# Patient Record
Sex: Female | Born: 1960 | Race: White | Hispanic: No | Marital: Single
Health system: Southern US, Community
[De-identification: ages and names within clinical notes are randomized; demographics above are authoritative.]

---

## 2014-04-27 ENCOUNTER — Telehealth: Payer: Self-pay | Admitting: *Deleted

## 2014-04-27 NOTE — Telephone Encounter (Signed)
Mailed medical releases to pt on 3/17 & pt to fax back to me.  Have not received anything yet.  Called and left a message for the pt to call me w/ a status of the releases so I can obtain them to get pt scheduled.

## 2016-10-26 ENCOUNTER — Other Ambulatory Visit: Payer: Self-pay | Admitting: Family Medicine

## 2016-10-26 ENCOUNTER — Ambulatory Visit
Admission: RE | Admit: 2016-10-26 | Discharge: 2016-10-26 | Disposition: A | Discharge status: Home / Self Care | Payer: BC Managed Care – PPO | Source: Ambulatory Visit | Attending: Family Medicine | Admitting: Family Medicine

## 2016-10-26 DIAGNOSIS — M25512 Pain in left shoulder: Secondary | ICD-10-CM

## 2016-11-03 ENCOUNTER — Other Ambulatory Visit (HOSPITAL_COMMUNITY): Payer: Self-pay | Admitting: Family Medicine

## 2016-11-03 DIAGNOSIS — M25512 Pain in left shoulder: Secondary | ICD-10-CM

## 2016-11-06 ENCOUNTER — Encounter (HOSPITAL_COMMUNITY): Payer: Self-pay

## 2016-11-06 ENCOUNTER — Ambulatory Visit (HOSPITAL_COMMUNITY)
Admission: RE | Admit: 2016-11-06 | Discharge: 2016-11-06 | Disposition: A | Discharge status: Home / Self Care | Payer: BC Managed Care – PPO | Source: Ambulatory Visit | Attending: Family Medicine | Admitting: Family Medicine

## 2019-04-02 ENCOUNTER — Ambulatory Visit: Payer: BC Managed Care – PPO

## 2019-07-04 ENCOUNTER — Other Ambulatory Visit: Payer: Self-pay

## 2019-07-04 ENCOUNTER — Ambulatory Visit
Admission: RE | Admit: 2019-07-04 | Discharge: 2019-07-04 | Disposition: A | Discharge status: Home / Self Care | Payer: BC Managed Care – PPO | Source: Ambulatory Visit | Attending: Family Medicine | Admitting: Family Medicine

## 2019-07-04 ENCOUNTER — Other Ambulatory Visit: Payer: Self-pay | Admitting: Family Medicine

## 2019-07-04 DIAGNOSIS — R52 Pain, unspecified: Secondary | ICD-10-CM

## 2020-08-20 ENCOUNTER — Ambulatory Visit
Admission: RE | Admit: 2020-08-20 | Discharge: 2020-08-20 | Disposition: A | Discharge status: Home / Self Care | Payer: BC Managed Care – PPO | Source: Ambulatory Visit | Attending: Family Medicine | Admitting: Family Medicine

## 2020-08-20 ENCOUNTER — Other Ambulatory Visit: Payer: Self-pay

## 2020-08-20 ENCOUNTER — Other Ambulatory Visit: Payer: Self-pay | Admitting: Family Medicine

## 2020-08-20 DIAGNOSIS — M25511 Pain in right shoulder: Secondary | ICD-10-CM

## 2021-06-07 IMAGING — CR DG ABDOMEN 1V
1 series · 1 of 1 positions shown · non-contrast
Comparison: None.

CLINICAL DATA: Right flank pain

EXAM:
ABDOMEN - 1 VIEW

[t abdomen supine]
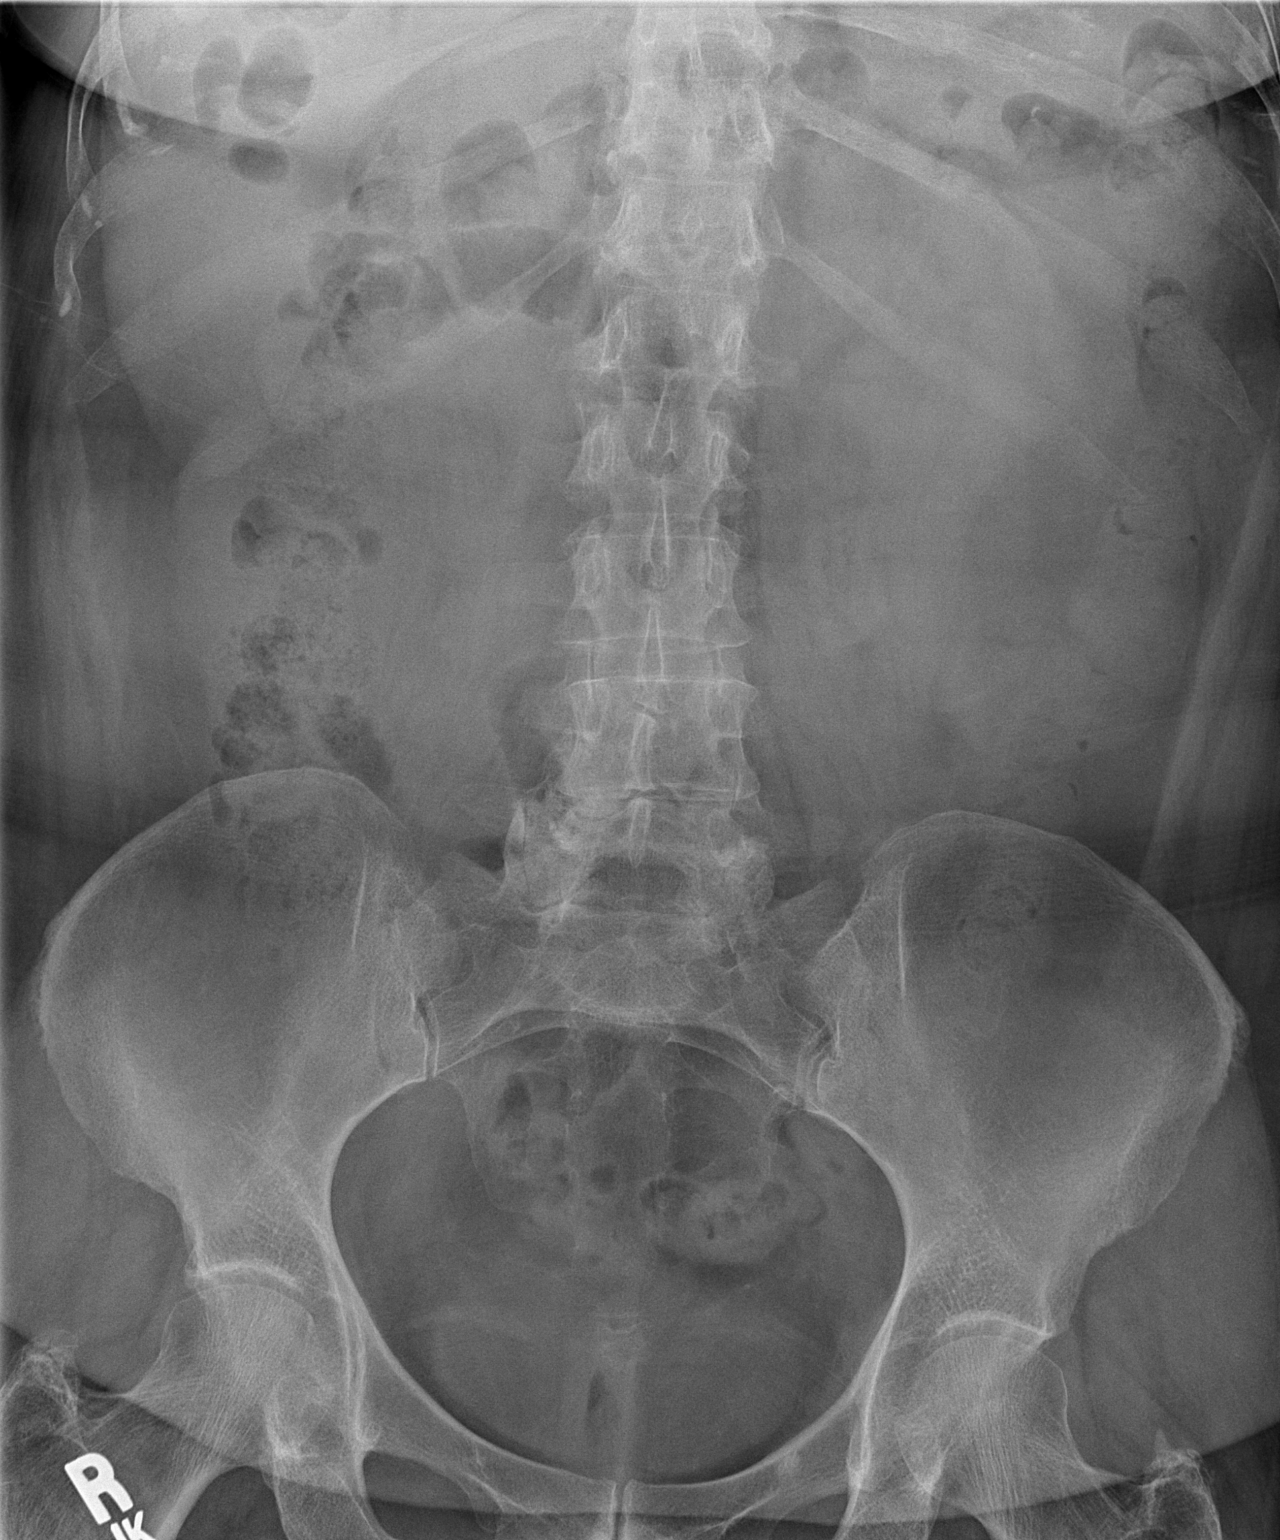

[1 of 1 positions shown; findings below may reference images not displayed]

FINDINGS: Supine frontal view of the abdomen and pelvis excludes the
hemidiaphragms by collimation. No bowel obstruction or ileus. There
are no masses or abnormal calcifications. No acute bony
abnormalities.
IMPRESSION: 1. Unremarkable exam. No evidence of radiopaque urinary tract
calculi.

## 2022-07-25 IMAGING — CR DG SHOULDER 2+V*R*
3 series · 3 of 3 positions shown · non-contrast
Comparison: Left shoulder radiograph dated 10/26/2016.

CLINICAL DATA: 68-year-old female with right shoulder pain.

EXAM:
RIGHT SHOULDER - 2+ VIEW

[w shoulder grashey right]
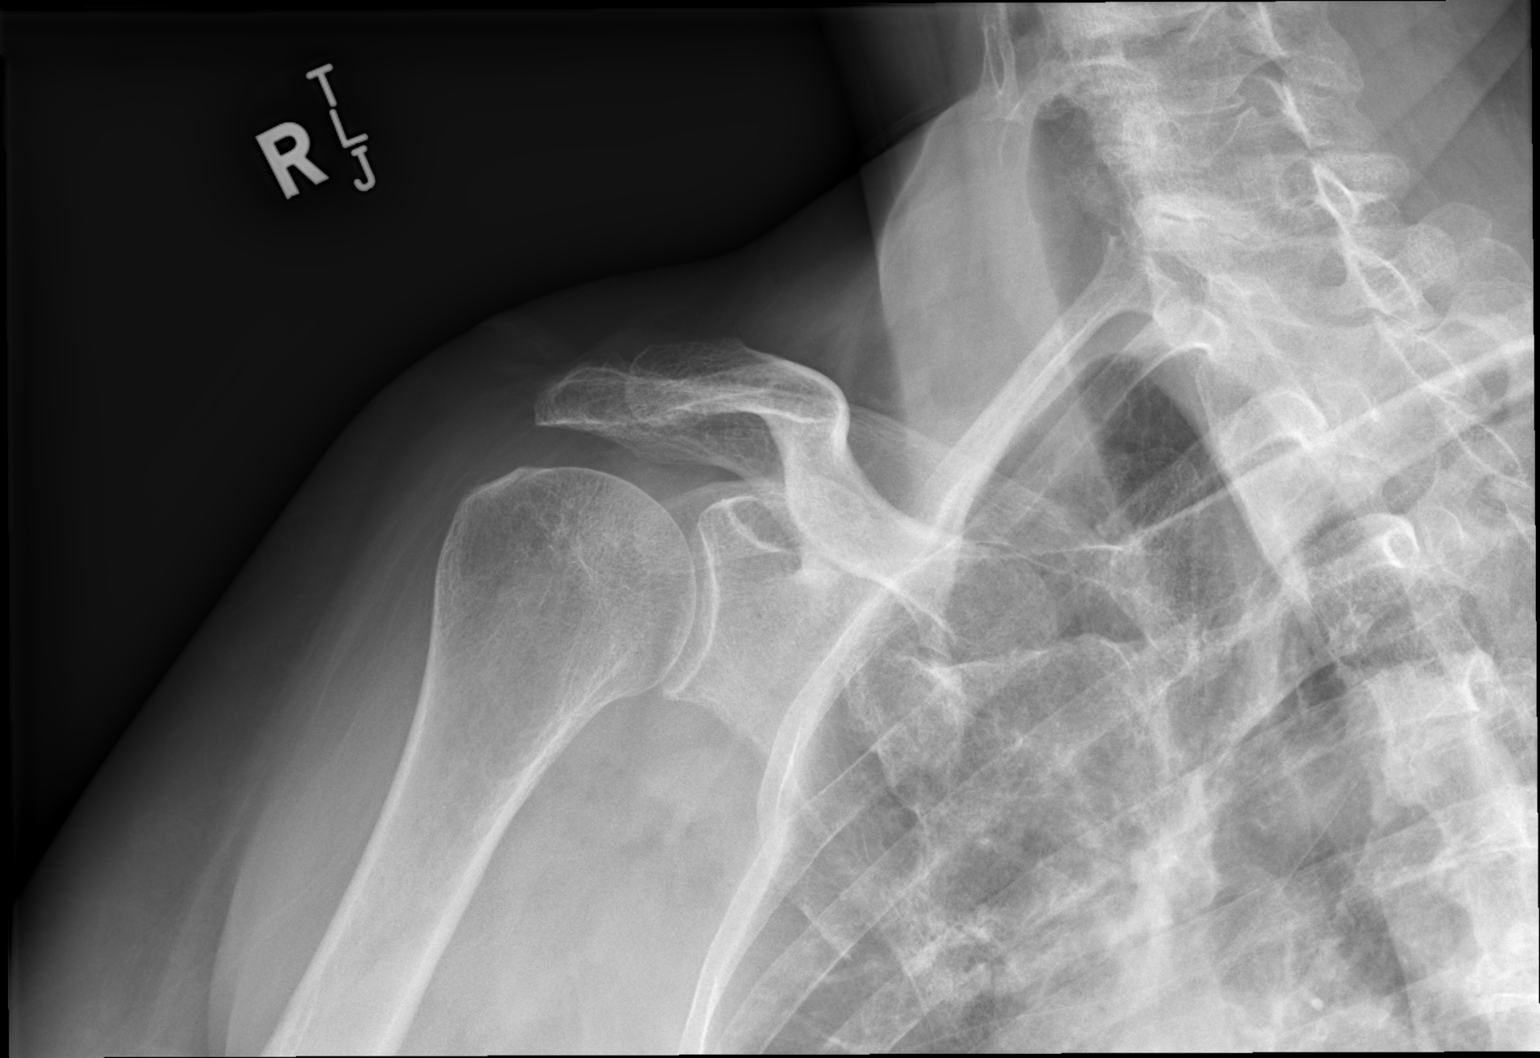

[w shoulder y-view right]
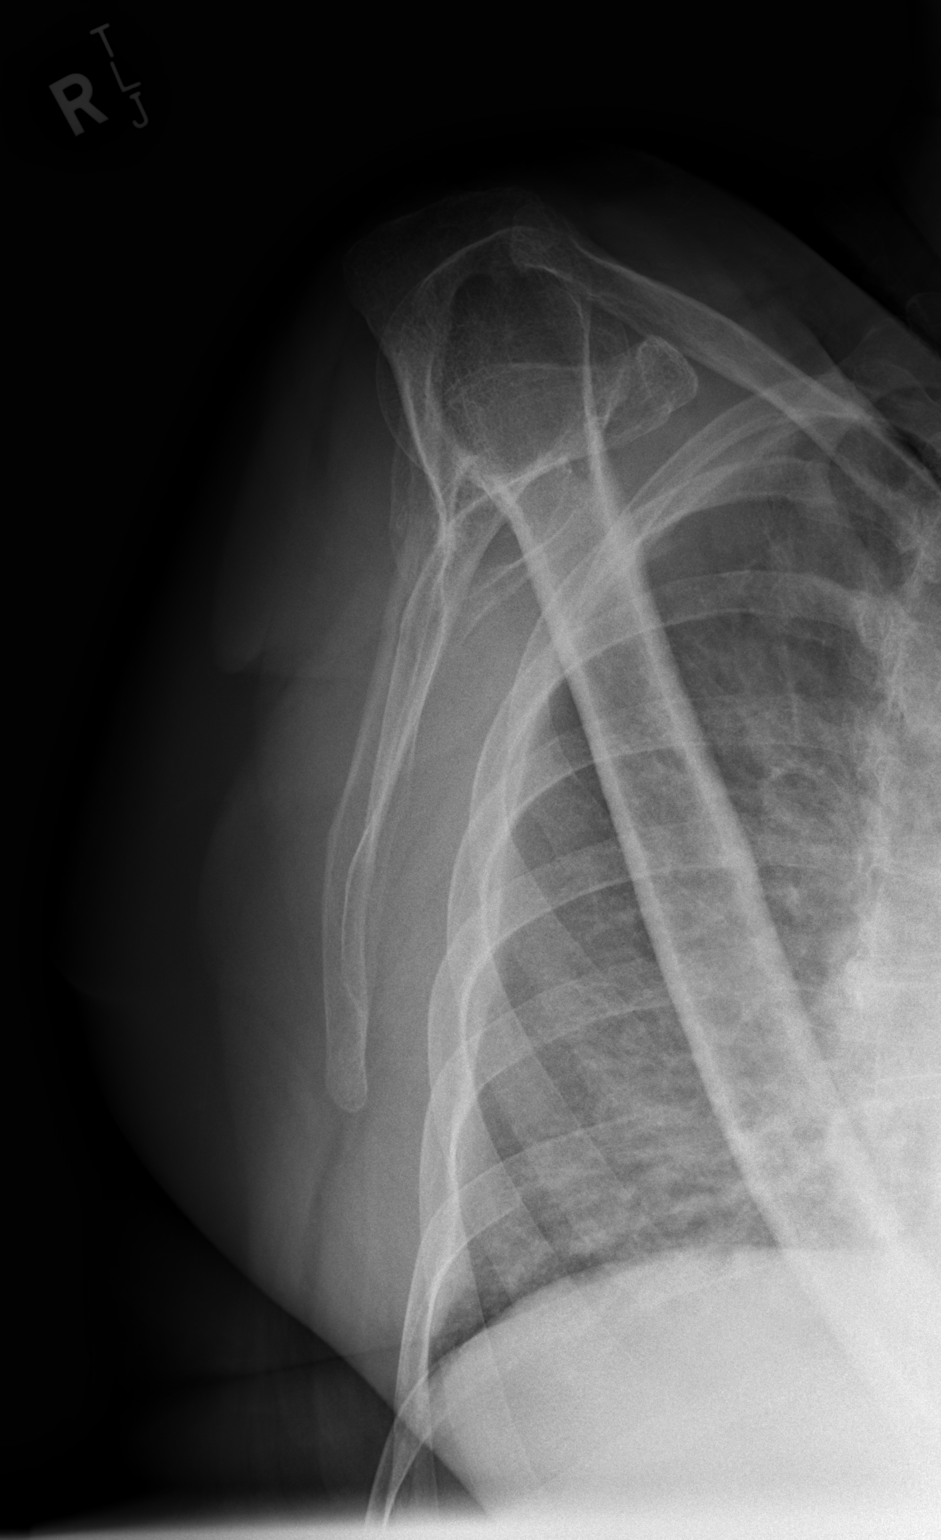

[w shoulder axillary right]
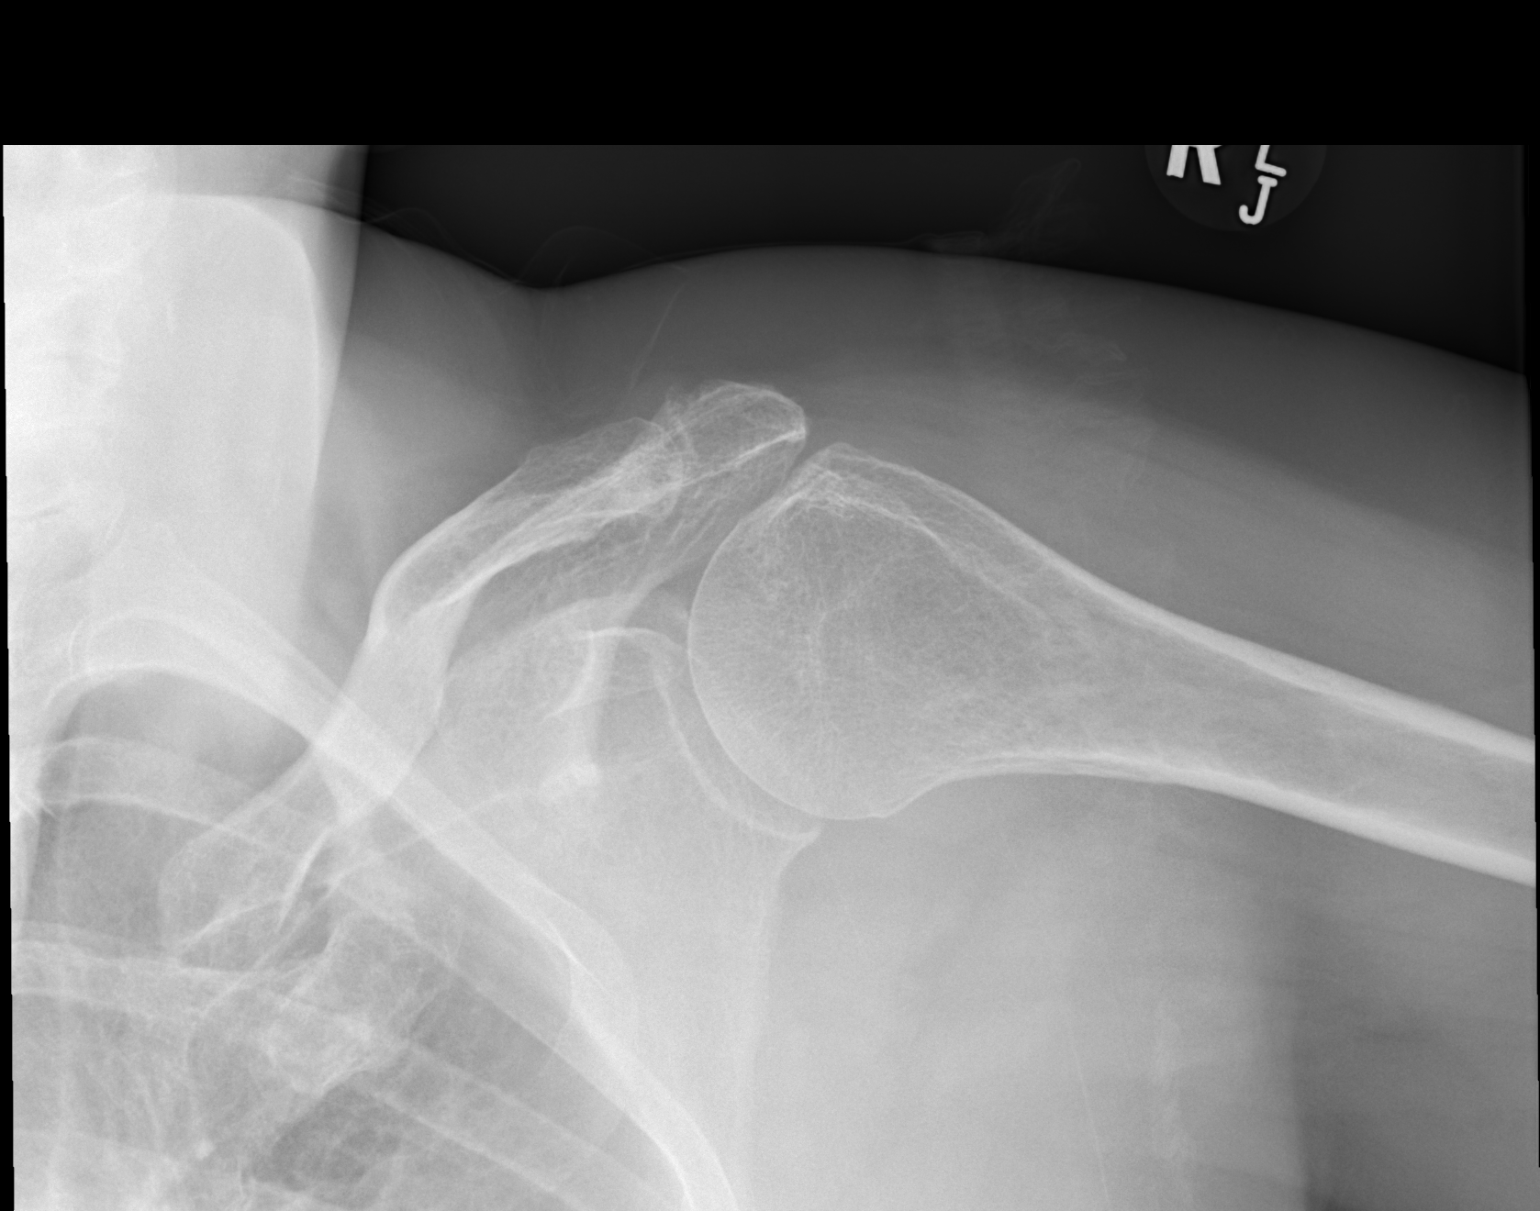

[3 of 3 positions shown; findings below may reference images not displayed]

FINDINGS: There is no acute fracture or dislocation. The bones are osteopenic.
No significant arthritic changes. The soft tissues are unremarkable.
IMPRESSION: No acute fracture or dislocation.

## 2022-10-19 ENCOUNTER — Other Ambulatory Visit: Payer: Self-pay | Admitting: Family Medicine

## 2022-10-19 ENCOUNTER — Ambulatory Visit
Admission: RE | Admit: 2022-10-19 | Discharge: 2022-10-19 | Disposition: A | Discharge status: Home / Self Care | Payer: BC Managed Care – PPO | Source: Ambulatory Visit | Attending: Family Medicine | Admitting: Family Medicine

## 2022-10-19 DIAGNOSIS — J0110 Acute frontal sinusitis, unspecified: Secondary | ICD-10-CM
